# Patient Record
Sex: Male | Born: 1965 | Race: White | Hispanic: No | Marital: Married
Health system: Southern US, Community
[De-identification: ages and names within clinical notes are randomized; demographics above are authoritative.]

---

## 2004-02-23 ENCOUNTER — Encounter: Admission: RE | Admit: 2004-02-23 | Discharge: 2004-02-23 | Payer: Self-pay | Admitting: Neurosurgery

## 2004-03-07 ENCOUNTER — Encounter: Admission: RE | Admit: 2004-03-07 | Discharge: 2004-03-07 | Payer: Self-pay | Admitting: Neurosurgery

## 2004-03-22 ENCOUNTER — Encounter: Admission: RE | Admit: 2004-03-22 | Discharge: 2004-03-22 | Payer: Self-pay | Admitting: Neurosurgery

## 2004-04-20 ENCOUNTER — Ambulatory Visit (HOSPITAL_COMMUNITY): Admission: RE | Admit: 2004-04-20 | Discharge: 2004-04-20 | Payer: Self-pay | Admitting: Neurosurgery

## 2004-06-12 ENCOUNTER — Inpatient Hospital Stay (HOSPITAL_COMMUNITY): Admission: RE | Admit: 2004-06-12 | Discharge: 2004-06-13 | Payer: Self-pay | Admitting: Neurosurgery

## 2005-01-03 ENCOUNTER — Ambulatory Visit (HOSPITAL_COMMUNITY): Admission: RE | Admit: 2005-01-03 | Discharge: 2005-01-03 | Payer: Self-pay | Admitting: Orthopaedic Surgery

## 2006-05-29 IMAGING — RF DG MYELOGRAM CERVICAL
4 series · 4 of 4 positions shown · non-contrast
Comparison: none

CLINICAL DATA: Right radicular pain.  
 CERVICAL MYELOGRAM 
 Lumbar puncture was performed by Dr. Sin.  Contrast was directed into the cervical region by patient positioning.  
 The study shows a prominent defect on the right at C5-6 with compression of the right C-6 nerve root.  Left-sided root sleeves appear unaffected.  Lateral view shows moderate spondylosis anteriorly at C5-6 with effacement of the ventral subarachnoid space.
 IMPRESSION
 Right C-6 nerve root compression.  
 POST-MYELOGRAM CT SCAN OF THE CERVICAL SPINE 
 Spiral scanning is performed from the skull base to T-2.  
 C1-2 and C2-3:  No abnormality.
 C3-4:  Minimal disc bulge but no herniation or stenosis. 
 C4-5:  Mild left-sided uncovertebral spondylosis with mild osteophytic encroachment upon the foramen on the left.
 C5-6:  There is pronounced spondylosis with posteriorly projecting osteophytes. There is bilateral neuroforaminal encroachment, more severe on the right than the left.  There is a defect that is particular prominent on the right, certain to compress the right C-6 nerve root.  It is difficult to tell whether this is simply due to spondylosis or if there could be some soft disc herniation as well.  
 C6-7:  Normal interspace.
 C7-T1:  Normal interspace.
 The significant pathology in this case seems to be at the C5-6 level where there is spondylosis.  There is osteophytic encroachment upon both neural foramina.  This is much more severe on the right than on the left where there is a prominent defect certain to compress the right C-6 nerve root.  I cannot tell with certainty if this is simply osteophytic encroachment or if there could be some coexistent soft disc material.  
 Left-sided uncovertebral disease at C4-5 with mild neuroforaminal encroachment.
 CT MULTIPLANAR REFORMATIONS OF THE CERVICAL SPINE
 Sagittal and coronal reformations are done which aid in depiction of the above described findings. 
 See above report.

[Series 1: run · 1 of 1 slices shown (1 of 4)]
[im 1/1]
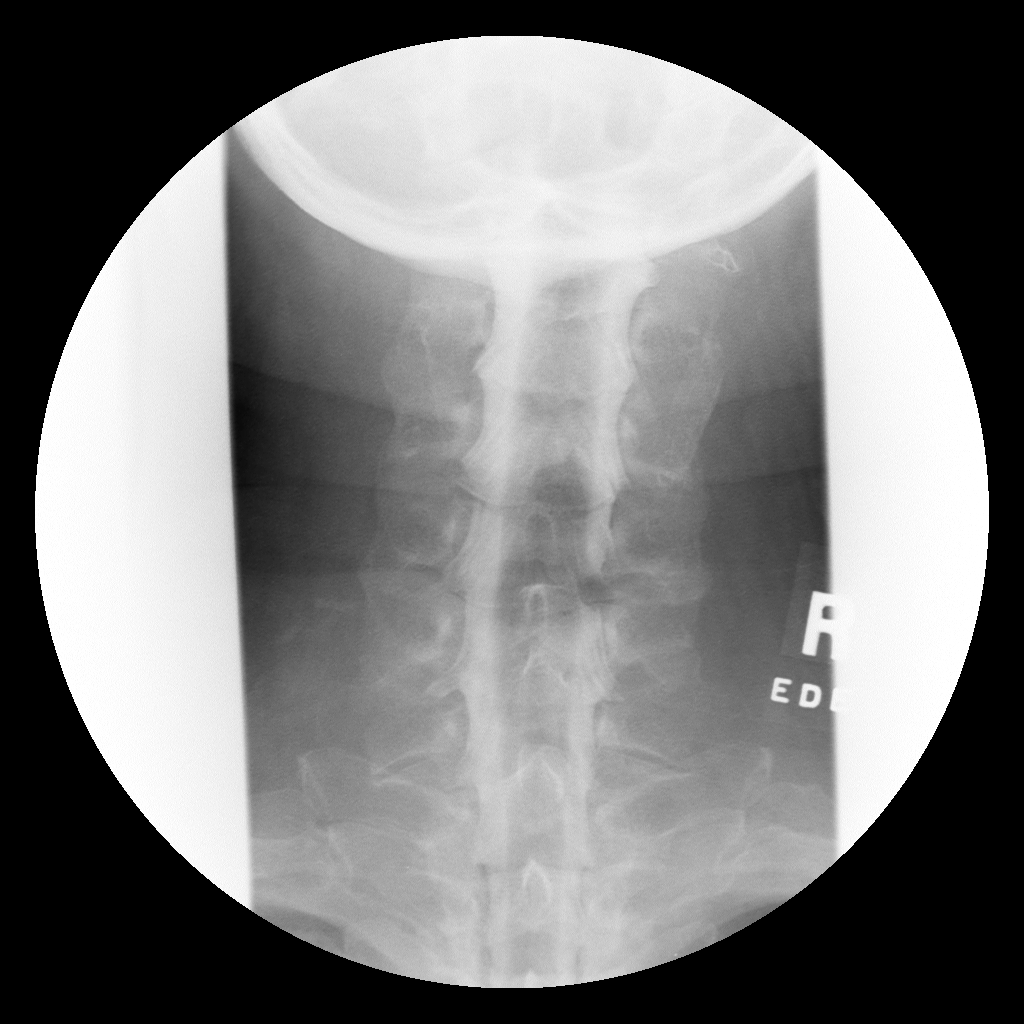

[Series 2: run · 1 of 1 slices shown (2 of 4)]
[im 1/1]
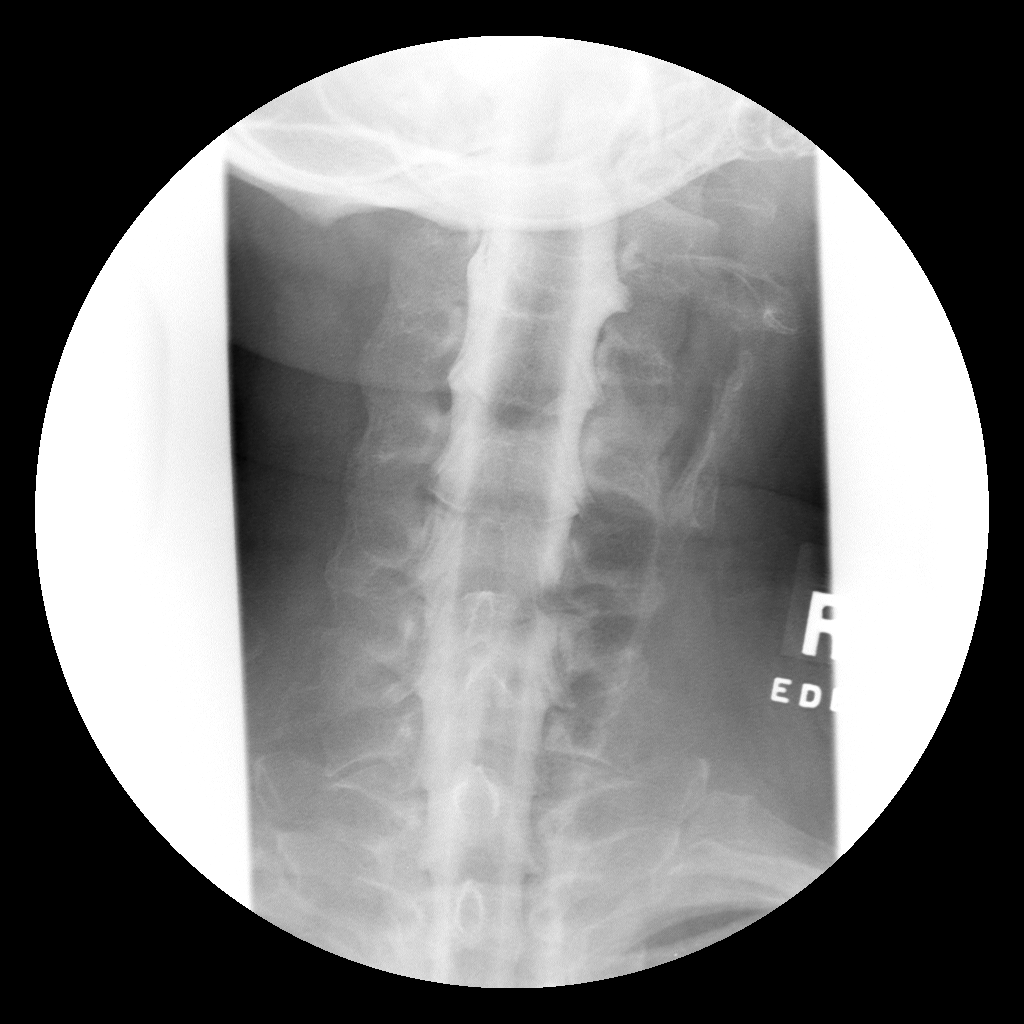

[Series 3: run · 1 of 1 slices shown (3 of 4)]
[im 1/1]
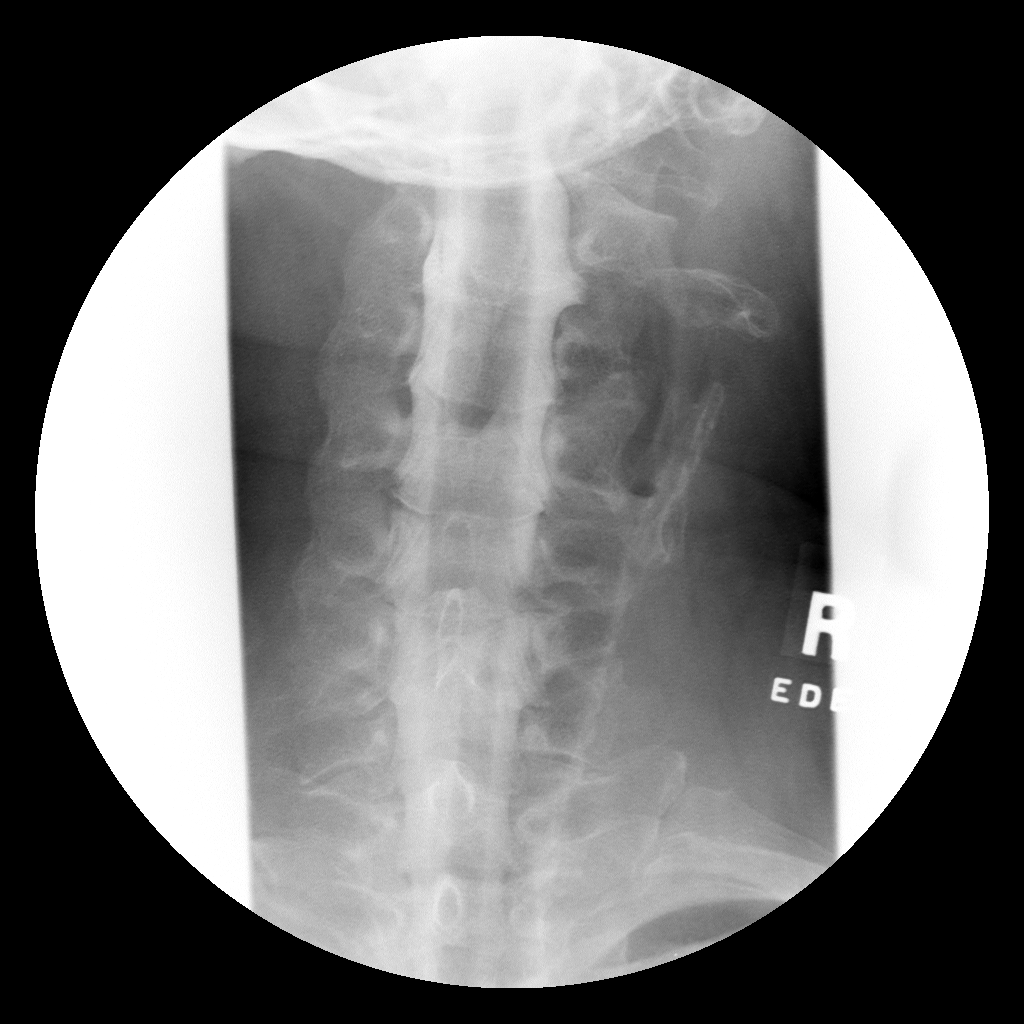

[Series 4: run · 1 of 1 slices shown (4 of 4)]
[im 1/1]
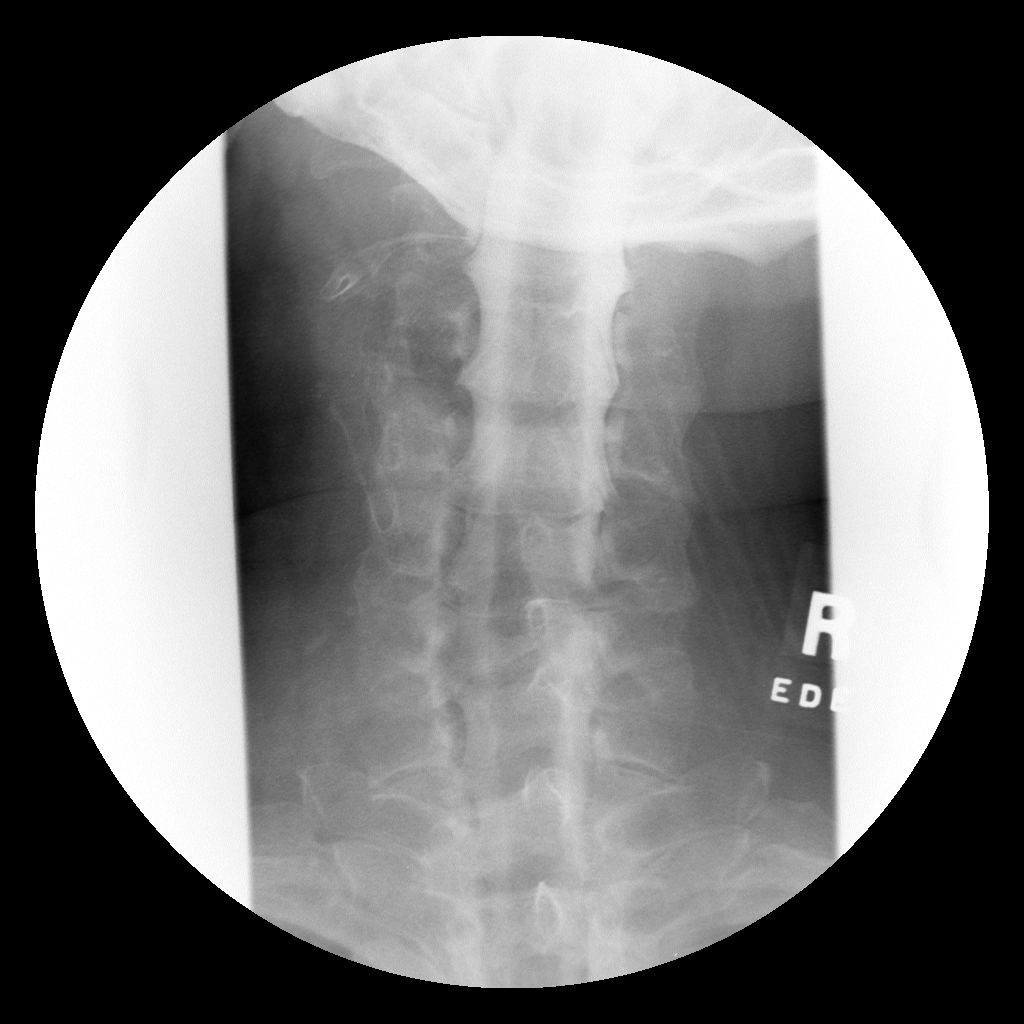

[4 of 4 positions shown; findings below may reference images not displayed]

## 2006-05-29 IMAGING — CT CT CERVICAL SPINE W/ CM
1 of 7 series · 9 of 20 positions shown, 12 images · non-contrast
Comparison: none

CLINICAL DATA: Right radicular pain.  
 CERVICAL MYELOGRAM 
 Lumbar puncture was performed by Dr. Sin.  Contrast was directed into the cervical region by patient positioning.  
 The study shows a prominent defect on the right at C5-6 with compression of the right C-6 nerve root.  Left-sided root sleeves appear unaffected.  Lateral view shows moderate spondylosis anteriorly at C5-6 with effacement of the ventral subarachnoid space.
 IMPRESSION
 Right C-6 nerve root compression.  
 POST-MYELOGRAM CT SCAN OF THE CERVICAL SPINE 
 Spiral scanning is performed from the skull base to T-2.  
 C1-2 and C2-3:  No abnormality.
 C3-4:  Minimal disc bulge but no herniation or stenosis. 
 C4-5:  Mild left-sided uncovertebral spondylosis with mild osteophytic encroachment upon the foramen on the left.
 C5-6:  There is pronounced spondylosis with posteriorly projecting osteophytes. There is bilateral neuroforaminal encroachment, more severe on the right than the left.  There is a defect that is particular prominent on the right, certain to compress the right C-6 nerve root.  It is difficult to tell whether this is simply due to spondylosis or if there could be some soft disc herniation as well.  
 C6-7:  Normal interspace.
 C7-T1:  Normal interspace.
 The significant pathology in this case seems to be at the C5-6 level where there is spondylosis.  There is osteophytic encroachment upon both neural foramina.  This is much more severe on the right than on the left where there is a prominent defect certain to compress the right C-6 nerve root.  I cannot tell with certainty if this is simply osteophytic encroachment or if there could be some coexistent soft disc material.  
 Left-sided uncovertebral disease at C4-5 with mild neuroforaminal encroachment.
 CT MULTIPLANAR REFORMATIONS OF THE CERVICAL SPINE
 Sagittal and coronal reformations are done which aid in depiction of the above described findings. 
 See above report.

[Series 102: cervical spine · axial · 0.27mm/px · z∈[+85,+213]mm · 9 of 510 slices shown, 12 images]
[im 51/510  soft-tissue]
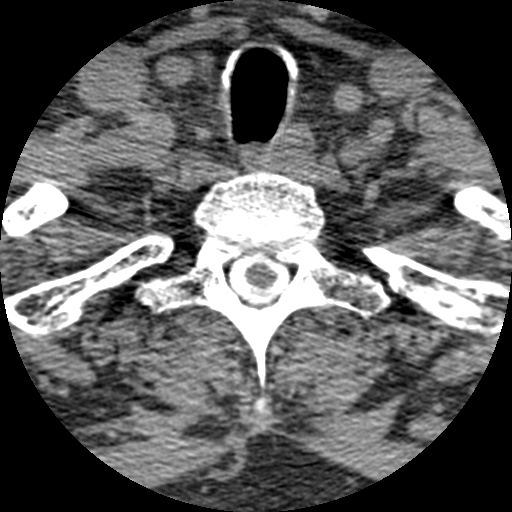
[im 51/510  bone]
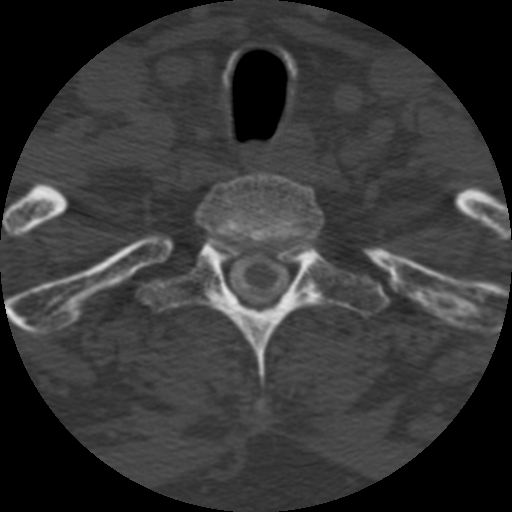
[im 102/510  bone]
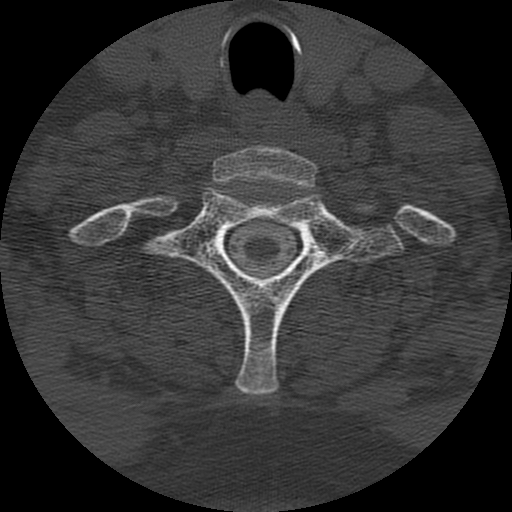
[im 153/510  bone]
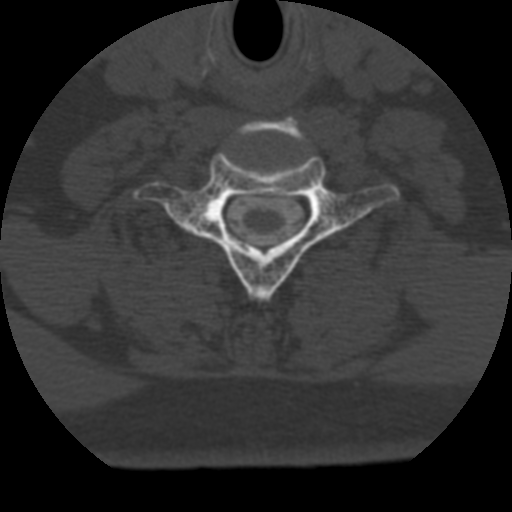
[im 204/510  bone]
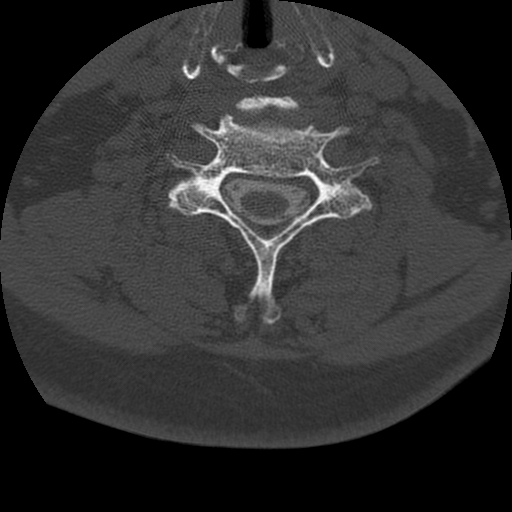
[im 255/510  soft-tissue]
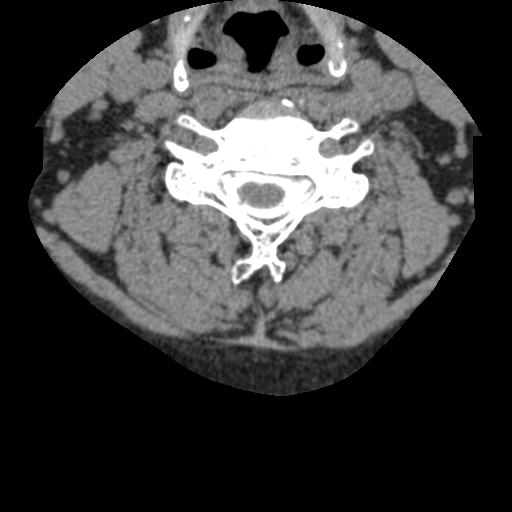
[im 255/510  bone]
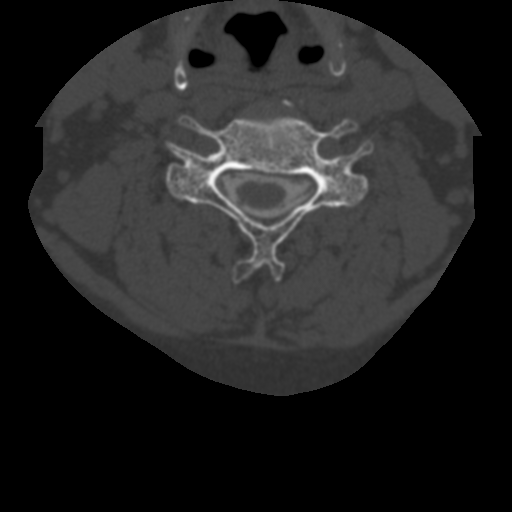
[im 306/510  bone]
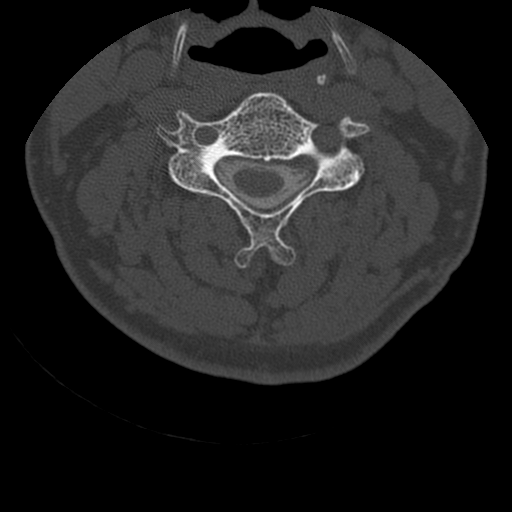
[im 357/510  bone]
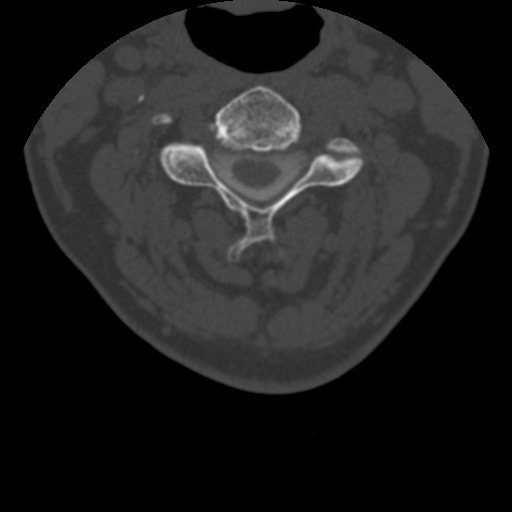
[im 408/510  bone]
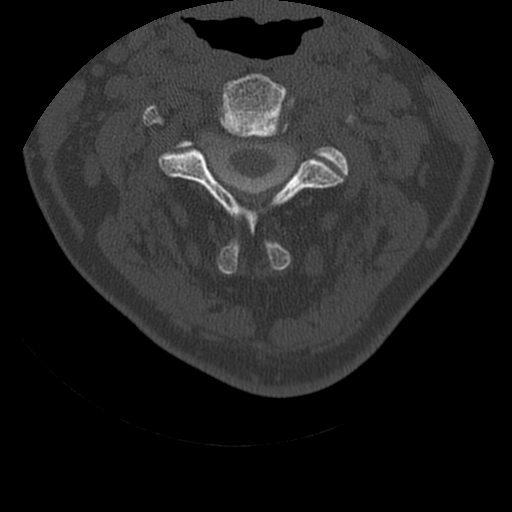
[im 459/510  soft-tissue]
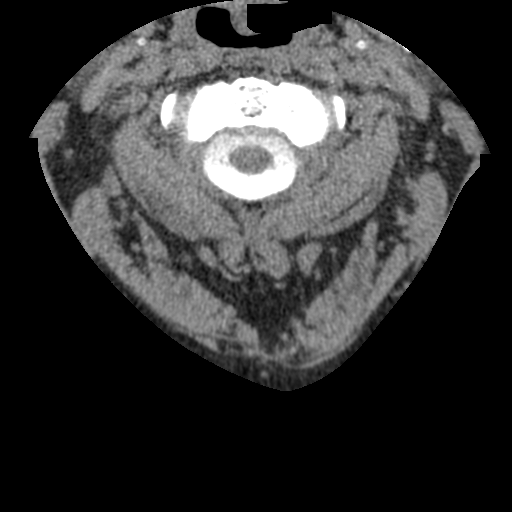
[im 459/510  bone]
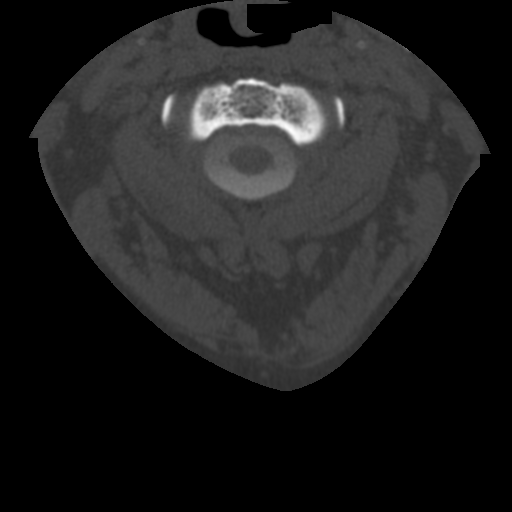

[9 of 20 positions shown; findings below may reference images not displayed]

## 2010-08-26 ENCOUNTER — Encounter: Payer: Self-pay | Admitting: Neurosurgery

## 2020-03-03 DIAGNOSIS — D696 Thrombocytopenia, unspecified: Secondary | ICD-10-CM | POA: Diagnosis not present

## 2020-03-31 DIAGNOSIS — D696 Thrombocytopenia, unspecified: Secondary | ICD-10-CM | POA: Diagnosis not present

## 2020-09-26 DIAGNOSIS — D696 Thrombocytopenia, unspecified: Secondary | ICD-10-CM | POA: Insufficient documentation

## 2020-09-28 ENCOUNTER — Other Ambulatory Visit: Payer: Self-pay | Admitting: Oncology

## 2020-09-28 DIAGNOSIS — D696 Thrombocytopenia, unspecified: Secondary | ICD-10-CM

## 2020-09-28 NOTE — Progress Notes (Deleted)
  [facility]  [address] [place],  [place]  [address] [phone]  Clinic Day:  09/28/2020  Referring physician: No ref. provider found   HISTORY OF PRESENT ILLNESS:  The patient is a 55 y.o. male with thrombocytopenia.  He comes in today to go over all his recent labs to determine the possible etiology behind his thrombocytopenia.  Since his last visit, the patient has been doing well.  He continues to deny having any significant subcutaneous bleeding/bruising issues which concern him for severe thrombocytopenia being present.    PHYSICAL EXAM:  There were no vitals taken for this visit. Wt Readings from Last 3 Encounters:  No data found for Wt   There is no height or weight on file to calculate BMI. Performance status (ECOG): {CHL ONC [id number] Physical Exam  LABS:  No flowsheet data found. No flowsheet data found.   No results found for: CEA1 / No results found for: CEA1 No results found for: PSA1 No results found for: LGX211 No results found for: CAN125  No results found for: TOTALPROTELP, ALBUMINELP, A1GS, A2GS, BETS, BETA2SER, GAMS, MSPIKE, SPEI No results found for: TIBC, FERRITIN, IRONPCTSAT No results found for: LDH  No results found for: AFPTUMOR, TOTALPROTELP, ALBUMINELP, A1GS, A2GS, BETS, BETA2SER, GAMS, MSPIKE, SPEI, LDH, CEA1, PSA1, IGASERUM, IGGSERUM, IGMSERUM, THGAB, THYROGLB  Recent Review Flowsheet Data   There is no flowsheet data to display.      STUDIES:  No results found.    ASSESSMENT & PLAN:   Assessment/Plan:  A 55 y.o. male with mild thrombocytopenia.  When reviewing all of his recent labs, he does not have any nutritional deficiencies factoring into his thrombocytopenia.  He also does not have thyroid disease factoring into his thrombocytopenia. As his liver enzymes are normal, it is highly unlikely he has hepatosplenic disease factoring into his thrombocytopenia.  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  He understands that he should not have any subcutaneous bleeding/bruising issues as long as his platelet count remains well above 20,000.  As he is clinically doing well, I will see this patient back in 6 months for repeat clinical assessment.  T.The patient understands all the plans discussed today and is in agreement with them.      Jahnay Lantier Kirby Funk, MD

## 2020-09-29 ENCOUNTER — Telehealth: Payer: Self-pay | Admitting: Oncology

## 2020-09-29 ENCOUNTER — Inpatient Hospital Stay: Payer: BLUE CROSS/BLUE SHIELD

## 2020-09-29 ENCOUNTER — Inpatient Hospital Stay: Payer: BLUE CROSS/BLUE SHIELD | Admitting: Oncology

## 2020-09-29 DIAGNOSIS — D696 Thrombocytopenia, unspecified: Secondary | ICD-10-CM

## 2020-09-29 NOTE — Telephone Encounter (Signed)
09/29/20 Patient cancelled appts and did not wan to reschedule.
